# Patient Record
Sex: Male | Born: 1990 | Hispanic: No | Marital: Single | Smoking: Never smoker
Health system: Southern US, Community
[De-identification: ages and names within clinical notes are randomized; demographics above are authoritative.]

---
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
## 2014-11-12 ENCOUNTER — Emergency Department (INDEPENDENT_AMBULATORY_CARE_PROVIDER_SITE_OTHER): Payer: 59

## 2014-11-12 ENCOUNTER — Encounter (HOSPITAL_COMMUNITY): Payer: Self-pay | Admitting: Emergency Medicine

## 2014-11-12 ENCOUNTER — Emergency Department (HOSPITAL_COMMUNITY)
Admission: EM | Admit: 2014-11-12 | Discharge: 2014-11-12 | Disposition: A | Discharge status: Home / Self Care | Payer: 59 | Source: Home / Self Care | Attending: Family Medicine | Admitting: Family Medicine

## 2014-11-12 DIAGNOSIS — Z23 Encounter for immunization: Secondary | ICD-10-CM

## 2014-11-12 DIAGNOSIS — S6710XA Crushing injury of unspecified finger(s), initial encounter: Secondary | ICD-10-CM

## 2014-11-12 DIAGNOSIS — S6000XA Contusion of unspecified finger without damage to nail, initial encounter: Secondary | ICD-10-CM

## 2014-11-12 DIAGNOSIS — S6010XA Contusion of unspecified finger with damage to nail, initial encounter: Secondary | ICD-10-CM

## 2014-11-12 MED ORDER — TETANUS-DIPHTH-ACELL PERTUSSIS 5-2.5-18.5 LF-MCG/0.5 IM SUSP
INTRAMUSCULAR | Status: AC
  Filled 2014-11-12: qty 0.5

## 2014-11-12 MED ORDER — IPRATROPIUM-ALBUTEROL 0.5-2.5 (3) MG/3ML IN SOLN
RESPIRATORY_TRACT | Status: AC
Start: 2014-11-12 — End: 2014-11-12
  Filled 2014-11-12: qty 3

## 2014-11-12 MED ORDER — TETANUS-DIPHTH-ACELL PERTUSSIS 5-2.5-18.5 LF-MCG/0.5 IM SUSP
0.5000 mL | Freq: Once | INTRAMUSCULAR | Status: AC
  Administered 2014-11-12: 0.5 mL via INTRAMUSCULAR

## 2014-11-12 MED ORDER — ALBUTEROL SULFATE (2.5 MG/3ML) 0.083% IN NEBU
INHALATION_SOLUTION | RESPIRATORY_TRACT | Status: AC
  Filled 2014-11-12: qty 3

## 2014-11-12 NOTE — Discharge Instructions (Signed)
Thank you for coming in today.  [facility]. Address: [address], [place], [place] [address] [phone]   Subungual Hematoma A subungual hematoma is a pocket of blood that collects under the fingernail or toenail. The pressure created by the blood under the nail can cause pain. CAUSES  A subungual hematoma occurs when an injury to the finger or toe causes a blood vessel beneath the nail to break. The injury can occur from a direct blow such as slamming a finger in a door. It can also occur from a repeated injury such as pressure on the foot in a shoe while running. A subungual hematoma is sometimes called runner's toe or tennis toe. SYMPTOMS   Blue or dark blue skin under the nail.  Pain or throbbing in the injured area. DIAGNOSIS  Your caregiver can determine whether you have a subungual hematoma based on your history and a physical exam. If your caregiver thinks you might have a broken (fractured) bone, X-rays may be taken. TREATMENT  Hematomas usually go away on their own over time. Your caregiver may make a hole in the nail to drain the blood. Draining the blood is painless and usually provides significant relief from pain and throbbing. The nail usually grows back normally after this procedure. In some cases, the nail may need to be removed. This is done if there is a cut under the nail that requires stitches (sutures). HOME CARE INSTRUCTIONS   Put ice on the injured area.  Put ice in a plastic bag.  Place a towel between your skin and the bag.  Leave the ice on for 15-20 minutes, 03-04 times a day for the first 1 to 2 days.  Elevate the injured area to help decrease pain and swelling.  If you were given a bandage, wear it for as long as directed by your caregiver.  If part of your nail falls off, trim the remaining nail gently. This prevents the nail from catching on something and causing further injury.  Only take  over-the-counter or prescription medicines for pain, discomfort, or fever as directed by your caregiver. SEEK IMMEDIATE MEDICAL CARE IF:   You have redness or swelling around the nail.  You have yellowish-white fluid (pus) coming from the nail.  Your pain is not controlled with medicine.  You have a fever. MAKE SURE YOU:  Understand these instructions.  Will watch your condition.  Will get help right away if you are not doing well or get worse. Document Released: 07/29/2000 Document Revised: 10/24/2011 Document Reviewed: 07/20/2011 [facility] Patient Information 2015 [employer] [place]. This information is not intended to replace advice given to you by your health care provider. Make sure you discuss any questions you have with your health care provider.

## 2014-11-12 NOTE — ED Provider Notes (Signed)
Ernest Cooper is a 24 y.o. male who presents to Urgent Care today for left fifth digit injury. Patient was mowing his yard at home when his left fifth digit became crushed between the lawnmower in the fence. He notes pain swelling and a small abrasion. He has not tried any treatment yet. No fevers or chills nausea vomiting or diarrhea. Symptoms are moderate. Patient cannot recall his last tetanus vaccine.   History reviewed. No pertinent past medical history. History reviewed. No pertinent past surgical history. History  Substance Use Topics  . Smoking status: Never Smoker   . Smokeless tobacco: Not on file  . Alcohol Use: No   ROS as above Medications: No current facility-administered medications for this encounter.   No current outpatient prescriptions on file.   No Known Allergies   Exam:  BP 113/82 mmHg  Pulse 60  Temp(Src) 98.6 F (37 C) (Oral)  Resp 16  SpO2 100% Gen: Well NAD Left hand. Swollen erythremia and subungual hematoma with small abrasion present at the distal left fifth phalanx. Motion intact. Sensation and capillary refill intact. Tender to touch. Hand is otherwise normal with normal motion pulses capillary refill sensation and grip.   Patient was given a Tdap vaccine prior to DC.  No results found for this or any previous visit (from the past 24 hour(s)). Dg Finger Little Left  11/12/2014   CLINICAL DATA:  Fifth finger crush injury  EXAM: LEFT LITTLE FINGER 2+V  COMPARISON:  None.  FINDINGS: Three views of the left fifth finger submitted. No acute fracture or subluxation. No radiopaque foreign body.  IMPRESSION: Negative.   Electronically Signed   By: Natasha MeadLiviu  Pop M.D.   On: 11/12/2014 09:39    Assessment and Plan: 24 y.o. male with crush injury with subungual hematoma to the left fifth nail. Patient declines attempted drainage of subungual hematoma. Plan for STACK splint NSAIDs watchful waiting. Return as needed.  Discussed warning signs or symptoms. Please  see discharge instructions. Patient expresses understanding.     Ernest BongEvan S Collins Dimaria, MD 11/12/14 [phone]

## 2014-11-12 NOTE — ED Notes (Signed)
Reports he inj 5th left digit yest while mowing grass Pressed finger against lawn mower and fence Has a subungual hematoma Alert, no signs of acute distress.

## 2016-04-16 ENCOUNTER — Ambulatory Visit (INDEPENDENT_AMBULATORY_CARE_PROVIDER_SITE_OTHER): Payer: Self-pay | Admitting: Urgent Care

## 2016-04-16 VITALS — BP 128/76 | HR 80 | Temp 98.7°F | Resp 16 | Ht 67.0 in | Wt 159.0 lb

## 2016-04-16 DIAGNOSIS — L299 Pruritus, unspecified: Secondary | ICD-10-CM

## 2016-04-16 DIAGNOSIS — R21 Rash and other nonspecific skin eruption: Secondary | ICD-10-CM

## 2016-04-16 LAB — POCT CBC
Granulocyte percent: 53.9 %G (ref 37–80)
HEMATOCRIT: 36.7 % — AB (ref 43.5–53.7)
Hemoglobin: 11.3 g/dL — AB (ref 14.1–18.1)
LYMPH, POC: 2.7 (ref 0.6–3.4)
MCH, POC: 16.9 pg — AB (ref 27–31.2)
MCHC: 30.9 g/dL — AB (ref 31.8–35.4)
MCV: 54.6 fL — AB (ref 80–97)
MID (cbc): 0.9 (ref 0–0.9)
MPV: 11.6 fL (ref 0–99.8)
POC GRANULOCYTE: 4.2 (ref 2–6.9)
POC LYMPH %: 35.1 % (ref 10–50)
POC MID %: 11 %M (ref 0–12)
Platelet Count, POC: 297 10*3/uL (ref 142–424)
RBC: 6.73 M/uL — AB (ref 4.69–6.13)
RDW, POC: 26.8 %
WBC: 7.8 10*3/uL (ref 4.6–10.2)

## 2016-04-16 LAB — POCT SKIN KOH: Skin KOH, POC: NEGATIVE

## 2016-04-16 MED ORDER — PREDNISONE 20 MG PO TABS: 40.0000 mg | ORAL_TABLET | Freq: Every day | ORAL | 0 refills | Status: DC

## 2016-04-16 MED ORDER — CETIRIZINE HCL 10 MG PO TABS: 10.0000 mg | ORAL_TABLET | Freq: Every day | ORAL | 11 refills | Status: DC

## 2016-04-16 NOTE — Progress Notes (Signed)
    MRN: [id number] DOB: 09/18/1990  Subjective:   Ernest Cooper is a [age] y.o. male presenting for chief complaint of Rash (On arms, back and neck x 2 weeks)  Reports 1 week history of pruritic rash that started over back and has now spread to his chest and arms. Patient has tried otc creams without relief. His co-worker did give patient 2 steroid pills which helped but the rash came right back. Patient admits working in the yard prior to the rash starting but is not aware of coming into contact with any poisonous plants. Denies fever, cough, headache, n/v, abdominal pain, lesions over mouth, legs, genital area. He has not changed hygiene products, no new medications. Patient uses whey protein for shakes, works out a gym. He has a dog at home, has been around for several months now.  [facility] currently has no medications in their medication list. Also has No Known Allergies.  [facility]  has no past medical history on file. Also  has no past surgical history on file.  Objective:   Vitals: BP 128/76   Pulse 80   Temp 98.7 F (37.1 C) (Oral)   Resp 16   Ht 5\' 7"  (1.702 m)   Wt 159 lb (72.1 kg)   SpO2 97%   BMI 24.90 kg/m   Physical Exam  Constitutional: He is oriented to person, place, and time. He appears well-developed and well-nourished.  HENT:  Mouth/Throat: Oropharynx is clear and moist.  Neck: Normal range of motion. Neck supple.  Cardiovascular: Normal rate, regular rhythm and intact distal pulses.  Exam reveals no gallop and no friction rub.   No murmur heard. Pulmonary/Chest: No respiratory distress. He has no wheezes. He has no rales.  Lymphadenopathy:    He has no cervical adenopathy.  Neurological: He is alert and oriented to person, place, and time.  Skin: Skin is warm and dry. Rash (maculopapular rash in clusters over mid-upper back, upper chest and upper inner part of biceps) noted.   Results for orders placed or performed in visit on 04/16/16 (from the past 24 hour(s))    POCT Skin KOH     Status: Normal   Collection Time: 04/16/16 12:58 PM  Result Value Ref Range   Skin KOH, POC Negative    Assessment and Plan :   This case was precepted with Dr. Cleta Albertsaub.   1. Rash and nonspecific skin eruption 2. Itching - Suspect a contact dermatitis rash to unknown offending agent. Will start patient with prednisone taper, Zyrtec for itching. Advised patient return to clinic if he has rebound rash, consider adding additional 1-2 weeks of steroid taper to address contact dermatitis to plant given that he was doing yardwork just prior to getting this rash. Patient is in agreement.  Wallis BambergMario Traves Majchrzak, PA-C [facility] [phone] 04/16/2016 12:11 PM

## 2016-04-16 NOTE — Patient Instructions (Addendum)
Rash A rash is a change in the color or texture of the skin. There are many different types of rashes. You may have other problems that accompany your rash. CAUSES   Infections.  Allergic reactions. This can include allergies to pets or foods.  Certain medicines.  Exposure to certain chemicals, soaps, or cosmetics.  Heat.  Exposure to poisonous plants.  Tumors, both cancerous and noncancerous. SYMPTOMS   Redness.  Scaly skin.  Itchy skin.  Dry or cracked skin.  Bumps.  Blisters.  Pain. DIAGNOSIS  Your caregiver may do a physical exam to determine what type of rash you have. A skin sample (biopsy) may be taken and examined under a microscope. TREATMENT  Treatment depends on the type of rash you have. Your caregiver may prescribe certain medicines. For serious conditions, you may need to see a skin doctor (dermatologist). HOME CARE INSTRUCTIONS   Avoid the substance that caused your rash.  Do not scratch your rash. This can cause infection.  You may take cool baths to help stop itching.  Only take over-the-counter or prescription medicines as directed by your caregiver.  Keep all follow-up appointments as directed by your caregiver. SEEK IMMEDIATE MEDICAL CARE IF:  You have increasing pain, swelling, or redness.  You have a fever.  You have new or severe symptoms.  You have body aches, diarrhea, or vomiting.  Your rash is not better after 3 days. MAKE SURE YOU:  Understand these instructions.  Will watch your condition.  Will get help right away if you are not doing well or get worse.   This information is not intended to replace advice given to you by your health care provider. Make sure you discuss any questions you have with your health care provider.   Document Released: 07/22/2002 Document Revised: 08/22/2014 Document Reviewed: 12/17/2014 Elsevier Interactive Patient Education 2016 Elsevier Inc.     IF you received an x-ray today, you will  receive an invoice from [facility]. Please contact [facility] at [phone] with questions or concerns regarding your invoice.   IF you received labwork today, you will receive an invoice from [facility]. Please contact [facility] at [phone] with questions or concerns regarding your invoice.   Our billing staff will not be able to assist you with questions regarding bills from these companies.  You will be contacted with the lab results as soon as they are available. The fastest way to get your results is to activate your My Chart account. Instructions are located on the last page of this paperwork. If you have not heard from us regarding the results in 2 weeks, please contact this office.      

## 2016-04-23 ENCOUNTER — Encounter: Payer: Self-pay | Admitting: Family Medicine

## 2016-04-23 ENCOUNTER — Ambulatory Visit (INDEPENDENT_AMBULATORY_CARE_PROVIDER_SITE_OTHER): Payer: Self-pay | Admitting: Family Medicine

## 2016-04-23 VITALS — BP 120/64 | HR 79 | Temp 98.7°F | Resp 16 | Ht 67.0 in | Wt 162.6 lb

## 2016-04-23 DIAGNOSIS — L259 Unspecified contact dermatitis, unspecified cause: Secondary | ICD-10-CM

## 2016-04-23 MED ORDER — TRIAMCINOLONE ACETONIDE 0.1 % EX CREA: 1.0000 "application " | TOPICAL_CREAM | Freq: Four times a day (QID) | CUTANEOUS | 0 refills | Status: DC | PRN

## 2016-04-23 MED ORDER — HYDROXYZINE HCL 25 MG PO TABS: 25.0000 mg | ORAL_TABLET | ORAL | 0 refills | Status: DC | PRN

## 2016-04-23 NOTE — Patient Instructions (Addendum)
   IF you received an x-ray today, you will receive an invoice from [facility]. Please contact  Radiology at [phone] with questions or concerns regarding your invoice.   IF you received labwork today, you will receive an invoice from [facility]. Please contact [facility] at [phone] with questions or concerns regarding your invoice.   Our billing staff will not be able to assist you with questions regarding bills from these companies.  You will be contacted with the lab results as soon as they are available. The fastest way to get your results is to activate your My Chart account. Instructions are located on the last page of this paperwork. If you have not heard from us regarding the results in 2 weeks, please contact this office.     Contact Dermatitis Dermatitis is redness, soreness, and swelling (inflammation) of the skin. Contact dermatitis is a reaction to certain substances that touch the skin. There are two types of contact dermatitis:   Irritant contact dermatitis. This type is caused by something that irritates your skin, such as dry hands from washing them too much. This type does not require previous exposure to the substance for a reaction to occur. This type is more common.  Allergic contact dermatitis. This type is caused by a substance that you are allergic to, such as a nickel allergy or poison ivy. This type only occurs if you have been exposed to the substance (allergen) before. Upon a repeat exposure, your body reacts to the substance. This type is less common. CAUSES  Many different substances can cause contact dermatitis. Irritant contact dermatitis is most commonly caused by exposure to:   Makeup.   Soaps.   Detergents.   Bleaches.   Acids.   Metal salts, such as nickel.  Allergic contact dermatitis is most commonly caused by exposure to:   Poisonous plants.   Chemicals.   Jewelry.   Latex.    Medicines.   Preservatives in products, such as clothing.  RISK FACTORS This condition is more likely to develop in:   People who have jobs that expose them to irritants or allergens.  People who have certain medical conditions, such as asthma or eczema.  SYMPTOMS  Symptoms of this condition may occur anywhere on your body where the irritant has touched you or is touched by you. Symptoms include:  Dryness or flaking.   Redness.   Cracks.   Itching.   Pain or a burning feeling.   Blisters.  Drainage of small amounts of blood or clear fluid from skin cracks. With allergic contact dermatitis, there may also be swelling in areas such as the eyelids, mouth, or genitals.  DIAGNOSIS  This condition is diagnosed with a medical history and physical exam. A patch skin test may be performed to help determine the cause. If the condition is related to your job, you may need to see an occupational medicine specialist. TREATMENT Treatment for this condition includes figuring out what caused the reaction and protecting your skin from further contact. Treatment may also include:   Steroid creams or ointments. Oral steroid medicines may be needed in more severe cases.  Antibiotics or antibacterial ointments, if a skin infection is present.  Antihistamine lotion or an antihistamine taken by mouth to ease itching.  A bandage (dressing). HOME CARE INSTRUCTIONS Skin Care  Moisturize your skin as needed.   Apply cool compresses to the affected areas.  Try taking a bath with:  Epsom salts. Follow the instructions   on the packaging. You can get these at your local pharmacy or grocery store.  Baking soda. Pour a small amount into the bath as directed by your health care provider.  Colloidal oatmeal. Follow the instructions on the packaging. You can get this at your local pharmacy or grocery store.  Try applying baking soda paste to your skin. Stir water into baking soda until  it reaches a paste-like consistency.  Do not scratch your skin.  Bathe less frequently, such as every other day.  Bathe in lukewarm water. Avoid using hot water. Medicines  Take or apply over-the-counter and prescription medicines only as told by your health care provider.   If you were prescribed an antibiotic medicine, take or apply your antibiotic as told by your health care provider. Do not stop using the antibiotic even if your condition starts to improve. General Instructions  Keep all follow-up visits as told by your health care provider. This is important.  Avoid the substance that caused your reaction. If you do not know what caused it, keep a journal to try to track what caused it. Write down:  What you eat.  What cosmetic products you use.  What you drink.  What you wear in the affected area. This includes jewelry.  If you were given a dressing, take care of it as told by your health care provider. This includes when to change and remove it. SEEK MEDICAL CARE IF:   Your condition does not improve with treatment.  Your condition gets worse.  You have signs of infection such as swelling, tenderness, redness, soreness, or warmth in the affected area.  You have a fever.  You have new symptoms. SEEK IMMEDIATE MEDICAL CARE IF:   You have a severe headache, neck pain, or neck stiffness.  You vomit.  You feel very sleepy.  You notice red streaks coming from the affected area.  Your bone or joint underneath the affected area becomes painful after the skin has healed.  The affected area turns darker.  You have difficulty breathing.   This information is not intended to replace advice given to you by your health care provider. Make sure you discuss any questions you have with your health care provider.   Document Released: 07/29/2000 Document Revised: 04/22/2015 Document Reviewed: 12/17/2014 Elsevier Interactive Patient Education 2016 Elsevier Inc.  

## 2016-04-23 NOTE — Progress Notes (Signed)
Subjective:  By signing my name below, I, Raven Small, attest that this documentation has been prepared under the direction and in the presence of Norberto Sorenson, MD.  Electronically Signed: Andrew Au, ED Scribe. 04/23/2016. 2:35 PM.   Patient ID: Ernest Cooper, male    DOB: Jul 10, 1991, 25 y.o.   MRN: [id number]  HPI Chief Complaint  Patient presents with  . Follow-up    Rash, on arms, chest and back    HPI Comments: Ernest Cooper is a 25 y.o. male who [facility] the [facility] follow up. Pt was seen 1 week ago by Gurney Maxin. At that time he had 1 week rash that began on back and spread across upper arms and chest, thought to be contact dermitis; treated with prednisone taper and zyrtec. Possibility of Rhus.   Pt reports improvement to rash and itchiness while on medication, but once he finish itchiness returned. Pt finished medication 3 days ago. He denies worsening symptoms or new areas of rash. He takes zyrtec every morning with breakfast but other treatment tried.   There are no active problems to display for this patient.  No past medical history on file. No past surgical history on file. No Known Allergies Prior to Admission medications   Medication Sig Start Date End Date Taking? Authorizing Provider  cetirizine (ZYRTEC) 10 MG tablet Take 1 tablet (10 mg total) by mouth daily. 04/16/16  Yes Wallis Bamberg, PA-C  predniSONE (DELTASONE) 20 MG tablet Take 2 tablets (40 mg total) by mouth daily with breakfast. Patient not taking: Reported on 04/23/2016 04/16/16   Wallis Bamberg, PA-C   Social History   Social History  . Marital status: Single    Spouse name: N/A  . Number of children: N/A  . Years of education: N/A   Occupational History  . Not on file.   Social History Main Topics  . Smoking status: Never Smoker  . Smokeless tobacco: Not on file  . Alcohol use No  . Drug use: No  . Sexual activity: Not on file   Other Topics Concern  . Not on file   Social  History Narrative  . No narrative on file   Review of Systems  Constitutional: Negative for activity change, chills and fever.  Cardiovascular: Negative for leg swelling.  Musculoskeletal: Negative for arthralgias, back pain, gait problem, joint swelling, myalgias, neck pain and neck stiffness.  Skin: Positive for rash. Negative for color change, pallor and wound.  Allergic/Immunologic: Negative for environmental allergies, food allergies and immunocompromised state.  Neurological: Negative for weakness and numbness.  Hematological: Negative for adenopathy. Does not bruise/bleed easily.       Objective:   Physical Exam  Constitutional: He is oriented to person, place, and time. He appears well-developed and well-nourished. No distress.  HENT:  Head: Normocephalic and atraumatic.  Eyes: Conjunctivae and EOM are normal.  Neck: Neck supple.  Cardiovascular: Normal rate.   Pulmonary/Chest: Effort normal.  Musculoskeletal: Normal range of motion.  Neurological: He is alert and oriented to person, place, and time.  Skin: Skin is warm and dry.  Very fine pin point papules along inner aspects.   Psychiatric: He has a normal mood and affect. His behavior is normal.  Nursing note and vitals reviewed.  Vitals:   04/23/16 1414  BP: 120/64  Pulse: 79  Resp: 16  Temp: 98.7 F (37.1 C)  TempSrc: Oral  SpO2: 99%  Weight: 162 lb 9.6 oz (73.8 kg)  Height: 5\' 7"  (1.702 m)    Assessment & Plan:   1. Contact dermatitis   Very subtle recurrence since cessation of po pred taper 2d prev.  Likely rhus dermatitis - start below, cont sched zyrtec   Meds ordered this encounter  Medications  . triamcinolone cream (KENALOG) 0.1 %    Sig: Apply 1 application topically 4 (four) times daily as needed.    Dispense:  453 g    Refill:  0  . hydrOXYzine (ATARAX/VISTARIL) 25 MG tablet    Sig: Take 1 tablet (25 mg total) by mouth every 4 (four) hours as needed for itching.    Dispense:  30 tablet     Refill:  0    I personally performed the services described in this documentation, which was scribed in my presence. The recorded information has been reviewed and considered, and addended by me as needed.   Norberto SorensonEva Kamira Mellette, M.D.  [facility]  [place] [address] [place], [place] [address] [phone] phone [phone] fax  04/25/16 11:37 PM

## 2016-05-27 IMAGING — DX DG FINGER LITTLE 2+V*L*
3 series · 3 of 3 positions shown · non-contrast
Comparison: None.

CLINICAL DATA: Fifth finger crush injury

EXAM:
LEFT LITTLE FINGER 2+V

[finger ap]
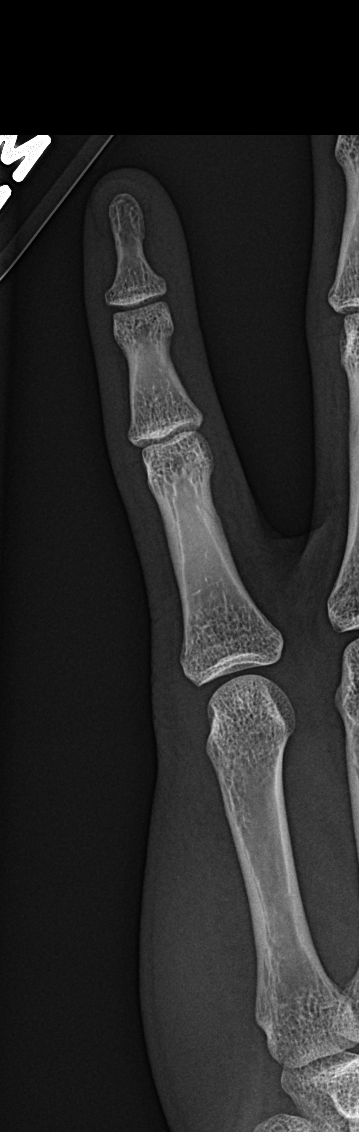

[finger obl]
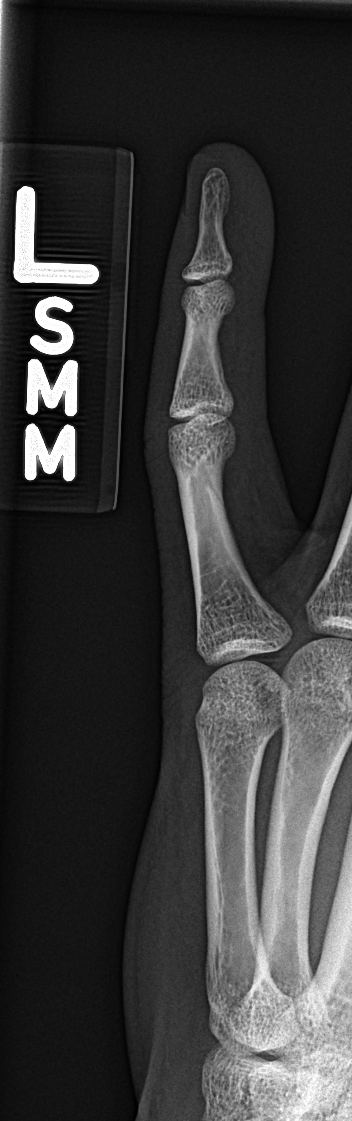

[finger lat]
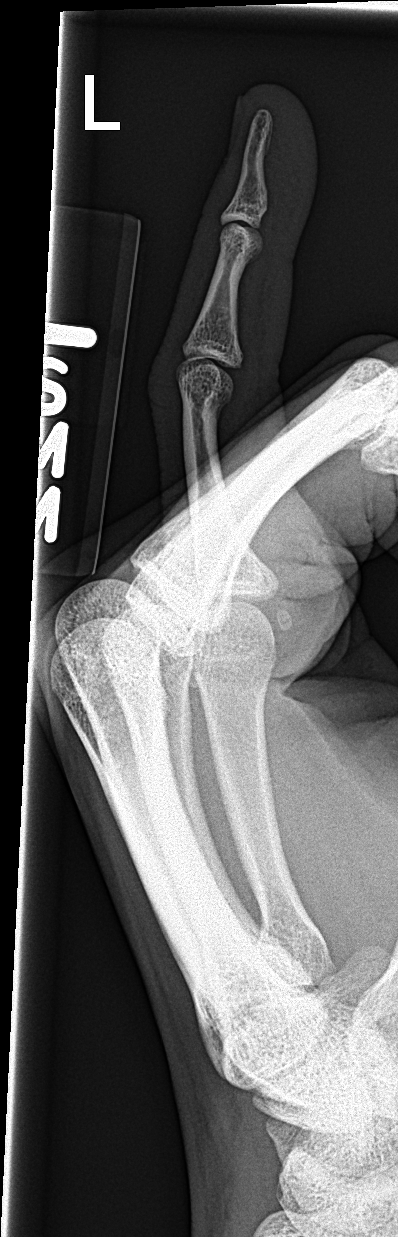

[3 of 3 positions shown; findings below may reference images not displayed]

FINDINGS: Three views of the left fifth finger submitted. No acute fracture or
subluxation. No radiopaque foreign body.
IMPRESSION: Negative.

## 2018-08-14 ENCOUNTER — Ambulatory Visit (HOSPITAL_COMMUNITY)
Admission: EM | Admit: 2018-08-14 | Discharge: 2018-08-14 | Disposition: A | Discharge status: Home / Self Care | Payer: Self-pay | Attending: Emergency Medicine | Admitting: Emergency Medicine

## 2018-08-14 ENCOUNTER — Encounter (HOSPITAL_COMMUNITY): Payer: Self-pay

## 2018-08-14 DIAGNOSIS — R05 Cough: Secondary | ICD-10-CM | POA: Insufficient documentation

## 2018-08-14 DIAGNOSIS — R059 Cough, unspecified: Secondary | ICD-10-CM

## 2018-08-14 DIAGNOSIS — R509 Fever, unspecified: Secondary | ICD-10-CM | POA: Insufficient documentation

## 2018-08-14 DIAGNOSIS — R52 Pain, unspecified: Secondary | ICD-10-CM | POA: Insufficient documentation

## 2018-08-14 DIAGNOSIS — R6889 Other general symptoms and signs: Secondary | ICD-10-CM | POA: Insufficient documentation

## 2018-08-14 MED ORDER — ACETAMINOPHEN 325 MG PO TABS
ORAL_TABLET | ORAL | Status: AC
  Filled 2018-08-14: qty 2

## 2018-08-14 MED ORDER — OSELTAMIVIR PHOSPHATE 75 MG PO CAPS: 75.0000 mg | ORAL_CAPSULE | Freq: Two times a day (BID) | ORAL | 0 refills | Status: DC

## 2018-08-14 MED ORDER — ACETAMINOPHEN 325 MG PO TABS
650.0000 mg | ORAL_TABLET | Freq: Once | ORAL | Status: AC
  Administered 2018-08-14: 650 mg via ORAL

## 2018-08-14 MED ORDER — PREDNISONE 10 MG (21) PO TBPK: ORAL_TABLET | Freq: Every day | ORAL | 0 refills | Status: DC

## 2018-08-14 NOTE — ED Provider Notes (Signed)
[facility]    CSN: [id number] Arrival date & time: 08/14/18  0831     History   Chief Complaint Chief Complaint  Patient presents with  . Generalized Body Aches  . Chills    HPI Ernest Cooper is a 27 y.o. male.   Pt here for fever, weakness, body aches, and cough. Pt states that his son was dx with flu last week. He has been feeling bad since sat/sun. States that he has taken several OTC for sx with no relief. Did not get the flu shot this year.      History reviewed. No pertinent past medical history.  There are no active problems to display for this patient.   History reviewed. No pertinent surgical history.     Home Medications    Prior to Admission medications   Medication Sig Start Date End Date Taking? Authorizing Provider  cetirizine (ZYRTEC) 10 MG tablet Take 1 tablet (10 mg total) by mouth daily. 04/16/16   Wallis BambergMani, Mario, PA-C  hydrOXYzine (ATARAX/VISTARIL) 25 MG tablet Take 1 tablet (25 mg total) by mouth every 4 (four) hours as needed for itching. 04/23/16   Sherren MochaShaw, Eva N, MD  oseltamivir (TAMIFLU) 75 MG capsule Take 1 capsule (75 mg total) by mouth every 12 (twelve) hours. 08/14/18   Coralyn MarkMitchell, Tedi Hughson L, NP  predniSONE (STERAPRED UNI-PAK 21 TAB) 10 MG (21) TBPK tablet Take by mouth daily. Take 6 tabs by mouth daily  for 2 days, then 5 tabs for 2 days, then 4 tabs for 2 days, then 3 tabs for 2 days, 2 tabs for 2 days, then 1 tab by mouth daily for 2 days 08/14/18   Coralyn MarkMitchell, Consuella Scurlock L, NP  triamcinolone cream (KENALOG) 0.1 % Apply 1 application topically 4 (four) times daily as needed. 04/23/16   Sherren MochaShaw, Eva N, MD    Family History History reviewed. No pertinent family history.  Social History Social History   Tobacco Use  . Smoking status: Never Smoker  Substance Use Topics  . Alcohol use: No  . Drug use: No     Allergies   Patient has no known allergies.   Review of Systems Review of Systems  Constitutional: Positive for activity change,  chills, fatigue and fever.  HENT: Negative.   Eyes: Negative.   Respiratory: Positive for cough.   Cardiovascular: Negative.   Gastrointestinal: Negative.   Genitourinary: Negative.   Musculoskeletal:       Bodyaches   Skin: Negative.   Neurological: Negative.      Physical Exam Triage Vital Signs ED Triage Vitals  Enc Vitals Group     BP 08/14/18 0942 124/75     Pulse Rate 08/14/18 0942 (!) 106     Resp 08/14/18 0942 18     Temp 08/14/18 0942 (!) 101.7 F (38.7 C)     Temp Source 08/14/18 0942 Oral     SpO2 08/14/18 0942 100 %     Weight --      Height --      Head Circumference --      Peak Flow --      Pain Score 08/14/18 0943 8     Pain Loc --      Pain Edu? --      Excl. in [place]? --    No data found.  Updated Vital Signs BP 124/75 (BP Location: Left Arm)   Pulse (!) 106   Temp (!) 101.7 F (38.7 C) (Oral)   Resp 18  SpO2 100%   Visual Acuity  Physical Exam Constitutional:      Appearance: Normal appearance.  HENT:     Head: Normocephalic.     Right Ear: Tympanic membrane normal.     Left Ear: Tympanic membrane normal.     Nose: Nose normal.     Mouth/Throat:     Mouth: Mucous membranes are moist.  Eyes:     Pupils: Pupils are equal, round, and reactive to light.  Neck:     Musculoskeletal: Normal range of motion.  Cardiovascular:     Rate and Rhythm: Tachycardia present.  Pulmonary:     Comments: Dry cough  Abdominal:     General: Abdomen is flat.  Musculoskeletal: Normal range of motion.  Skin:    Comments: Hot to touch   Neurological:     General: No focal deficit present.     Mental Status: He is alert.      UC Treatments / Results  Labs (all labs ordered are listed, but only abnormal results are displayed) Labs Reviewed - No data to display  EKG None  Radiology No results found.  Procedures Procedures (including critical care time)  Medications Ordered in UC Medications  acetaminophen (TYLENOL) tablet 650 mg (650 mg  Oral Given 08/14/18 0950)    Initial Impression / Assessment and Plan / UC Course  I have reviewed the triage vital signs and the nursing notes.  Pertinent labs & imaging results that were available during my care of the patient were reviewed by me and considered in my medical decision making (see chart for details).  Pushing fluids  Take meds as needed for fever Avoid contact with others and wash hands  Go to er if sx become worse   Final Clinical Impressions(s) / UC Diagnoses   Final diagnoses:  Flu-like symptoms  Cough  Generalized body aches  Fever, unspecified     Discharge Instructions     Drink plenty of fluids Take tylenol or motrin as needed for fever      ED Prescriptions    Medication Sig Dispense Auth. Provider   predniSONE (STERAPRED UNI-PAK 21 TAB) 10 MG (21) TBPK tablet Take by mouth daily. Take 6 tabs by mouth daily  for 2 days, then 5 tabs for 2 days, then 4 tabs for 2 days, then 3 tabs for 2 days, 2 tabs for 2 days, then 1 tab by mouth daily for 2 days 42 tablet Maple MirzaMitchell, Rochell Mabie L, NP   oseltamivir (TAMIFLU) 75 MG capsule Take 1 capsule (75 mg total) by mouth every 12 (twelve) hours. 10 capsule Coralyn MarkMitchell, Servando Kyllonen L, NP     Controlled Substance Prescriptions [place] Controlled Substance Registry consulted? Not Applicable   Coralyn MarkMitchell, Yasira Engelson L, NP 08/14/18 1019

## 2018-08-14 NOTE — ED Triage Notes (Signed)
Pt presents generalized body aches, fever and chills. Symptoms started on Saturday. Pt has tried OTC medication but no relief

## 2018-08-14 NOTE — Discharge Instructions (Signed)
Drink plenty of fluids Take tylenol or motrin as needed for fever

## 2022-08-28 ENCOUNTER — Ambulatory Visit
Admission: EM | Admit: 2022-08-28 | Discharge: 2022-08-28 | Disposition: A | Discharge status: Home / Self Care | Payer: Self-pay | Attending: Internal Medicine | Admitting: Internal Medicine

## 2022-08-28 ENCOUNTER — Ambulatory Visit (INDEPENDENT_AMBULATORY_CARE_PROVIDER_SITE_OTHER): Payer: Self-pay

## 2022-08-28 DIAGNOSIS — R053 Chronic cough: Secondary | ICD-10-CM

## 2022-08-28 DIAGNOSIS — R059 Cough, unspecified: Secondary | ICD-10-CM

## 2022-08-28 MED ORDER — BENZONATATE 100 MG PO CAPS: 100.0000 mg | ORAL_CAPSULE | Freq: Three times a day (TID) | ORAL | 0 refills | Status: DC | PRN

## 2022-08-28 MED ORDER — PREDNISONE 20 MG PO TABS: 40.0000 mg | ORAL_TABLET | Freq: Every day | ORAL | 0 refills | Status: AC

## 2022-08-28 NOTE — ED Triage Notes (Signed)
Pt presents with c/o cough x3 days.   States he has taken OTC medicine for relief.   States he took Robitussin and Mucinex. Reports he coughs up mucus and c/o the congestion worsening when laying down.

## 2022-08-28 NOTE — ED Provider Notes (Signed)
[facility]    CSN: [id number] Arrival date & time: 08/28/22  1003      History   Chief Complaint Chief Complaint  Patient presents with   Cough    HPI Ernest Cooper is a 32 y.o. male.   Patient presents with productive cough that has been present for 2 weeks.  He reports that he has intermittent nasal congestion but this has basically resolved.  Denies any associated fever.  Denies any known sick contacts.  Denies chest pain, shortness of breath, sore throat, ear pain, nausea, vomiting, diarrhea, abdominal pain.  Patient denies history of asthma and does not smoke cigarettes.  Has taken Robitussin and Mucinex with minimal improvement in symptoms.   Cough   History reviewed. No pertinent past medical history.  There are no problems to display for this patient.   History reviewed. No pertinent surgical history.     Home Medications    Prior to Admission medications   Medication Sig Start Date End Date Taking? Authorizing Provider  benzonatate (TESSALON) 100 MG capsule Take 1 capsule (100 mg total) by mouth every 8 (eight) hours as needed for cough. 08/28/22  Yes Latrese Carolan, Hildred Alamin E, FNP  predniSONE (DELTASONE) 20 MG tablet Take 2 tablets (40 mg total) by mouth daily for 5 days. 08/28/22 09/02/22 Yes Ruqayyah Lute, Michele Rockers, FNP  cetirizine (ZYRTEC) 10 MG tablet Take 1 tablet (10 mg total) by mouth daily. 04/16/16   Jaynee Eagles, PA-C  hydrOXYzine (ATARAX/VISTARIL) 25 MG tablet Take 1 tablet (25 mg total) by mouth every 4 (four) hours as needed for itching. 04/23/16   Shawnee Knapp, MD  oseltamivir (TAMIFLU) 75 MG capsule Take 1 capsule (75 mg total) by mouth every 12 (twelve) hours. 08/14/18   Marney Setting, NP  triamcinolone cream (KENALOG) 0.1 % Apply 1 application topically 4 (four) times daily as needed. 04/23/16   Shawnee Knapp, MD    Family History History reviewed. No pertinent family history.  Social History Social History   Tobacco Use   Smoking status: Never   Substance Use Topics   Alcohol use: No   Drug use: No     Allergies   Patient has no known allergies.   Review of Systems Review of Systems Per HPI  Physical Exam Triage Vital Signs ED Triage Vitals  Enc Vitals Group     BP 08/28/22 1115 (!) 99/51     Pulse Rate 08/28/22 1115 70     Resp 08/28/22 1115 18     Temp 08/28/22 1115 98.2 F (36.8 C)     Temp Source 08/28/22 1115 Oral     SpO2 08/28/22 1115 97 %     Weight --      Height --      Head Circumference --      Peak Flow --      Pain Score 08/28/22 1114 0     Pain Loc --      Pain Edu? --      Excl. in [place]? --    No data found.  Updated Vital Signs BP 130/76   Pulse 70   Temp 98.2 F (36.8 C) (Oral)   Resp 18   SpO2 97%   Visual Acuity Right Eye Distance:   Left Eye Distance:   Bilateral Distance:    Right Eye Near:   Left Eye Near:    Bilateral Near:     Physical Exam Constitutional:      General: He is  not in acute distress.    Appearance: Normal appearance. He is not toxic-appearing or diaphoretic.  HENT:     Head: Normocephalic and atraumatic.     Right Ear: Tympanic membrane and ear canal normal.     Left Ear: Tympanic membrane and ear canal normal.     Nose: No congestion.     Mouth/Throat:     Mouth: Mucous membranes are moist.     Pharynx: No posterior oropharyngeal erythema.  Eyes:     Extraocular Movements: Extraocular movements intact.     Conjunctiva/sclera: Conjunctivae normal.     Pupils: Pupils are equal, round, and reactive to light.  Cardiovascular:     Rate and Rhythm: Normal rate and regular rhythm.     Pulses: Normal pulses.     Heart sounds: Normal heart sounds.  Pulmonary:     Effort: Pulmonary effort is normal. No respiratory distress.     Breath sounds: Normal breath sounds. No stridor. No wheezing, rhonchi or rales.  Abdominal:     General: Abdomen is flat. Bowel sounds are normal.     Palpations: Abdomen is soft.  Musculoskeletal:        General: Normal  range of motion.     Cervical back: Normal range of motion.  Skin:    General: Skin is warm and dry.  Neurological:     General: No focal deficit present.     Mental Status: He is alert and oriented to person, place, and time. Mental status is at baseline.  Psychiatric:        Mood and Affect: Mood normal.        Behavior: Behavior normal.        Thought Content: Thought content normal.        Judgment: Judgment normal.      UC Treatments / Results  Labs (all labs ordered are listed, but only abnormal results are displayed) Labs Reviewed - No data to display  EKG   Radiology DG Chest 2 View  Result Date: 08/28/2022 CLINICAL DATA:  Pt presents with c/o cough x3 days. Reports he coughs up mucus and c/o the congestion worsening when laying down. EXAM: CHEST - 2 VIEW COMPARISON:  None Available. FINDINGS: The cardiomediastinal contours are within normal limits. The lungs are clear. No pneumothorax or pleural effusion. No acute finding in the visualized skeleton. IMPRESSION: No active cardiopulmonary disease. Electronically Signed   By: Emmaline Kluver M.D.   On: 08/28/2022 12:02    Procedures Procedures (including critical care time)  Medications Ordered in UC Medications - No data to display  Initial Impression / Assessment and Plan / UC Course  I have reviewed the triage vital signs and the nursing notes.  Pertinent labs & imaging results that were available during my care of the patient were reviewed by me and considered in my medical decision making (see chart for details).     Chest x-ray completed due to productive cough which was negative for any acute cardiopulmonary process.  Suspect viral cause of symptoms with persistent viral cough.  Will treat with prednisone to decrease inflammation.  Cough medication also prescribed.  Do not think viral testing is necessary given duration of symptoms as it would not change treatment.  Patient was advised to follow-up if symptoms  persist or worsen.  Patient verbalized understanding and was agreeable with plan. Final Clinical Impressions(s) / UC Diagnoses   Final diagnoses:  Persistent cough     Discharge Instructions  Your x-ray was normal.  I have prescribed you prednisone and a cough medication to decrease inflammation and help alleviate cough.  Please follow-up if any symptoms persist or worsen.     ED Prescriptions     Medication Sig Dispense Auth. Provider   predniSONE (DELTASONE) 20 MG tablet Take 2 tablets (40 mg total) by mouth daily for 5 days. 10 tablet [place], [place] E, [place]   benzonatate (TESSALON) 100 MG capsule Take 1 capsule (100 mg total) by mouth every 8 (eight) hours as needed for cough. 21 capsule [place], Michele Rockers, [place]      PDMP not reviewed this encounter.   Teodora Medici, [place] 08/28/22 1221

## 2022-08-28 NOTE — Discharge Instructions (Addendum)
Your x-ray was normal.  I have prescribed you prednisone and a cough medication to decrease inflammation and help alleviate cough.  Please follow-up if any symptoms persist or worsen.

## 2022-09-01 ENCOUNTER — Ambulatory Visit
Admission: RE | Admit: 2022-09-01 | Discharge: 2022-09-01 | Disposition: A | Discharge status: Home / Self Care | Payer: Self-pay | Source: Ambulatory Visit | Attending: Physician Assistant | Admitting: Physician Assistant

## 2022-09-01 VITALS — BP 111/73 | HR 63 | Temp 97.9°F | Resp 16

## 2022-09-01 DIAGNOSIS — J209 Acute bronchitis, unspecified: Secondary | ICD-10-CM

## 2022-09-01 DIAGNOSIS — R051 Acute cough: Secondary | ICD-10-CM

## 2022-09-01 MED ORDER — AZITHROMYCIN 250 MG PO TABS: ORAL_TABLET | ORAL | 0 refills | Status: DC

## 2022-09-01 MED ORDER — ALBUTEROL SULFATE HFA 108 (90 BASE) MCG/ACT IN AERS: 1.0000 | INHALATION_SPRAY | Freq: Four times a day (QID) | RESPIRATORY_TRACT | 0 refills | Status: DC | PRN

## 2022-09-01 MED ORDER — DM-GUAIFENESIN ER 30-600 MG PO TB12: 1.0000 | ORAL_TABLET | Freq: Two times a day (BID) | ORAL | 0 refills | Status: DC

## 2022-09-01 NOTE — ED Provider Notes (Signed)
[facility]    CSN: [id number] Arrival date & time: 09/01/22  1216      History   Chief Complaint Chief Complaint  Patient presents with   Cough    Came in on Sunday for a cough checked up, was given medication to take but seems like the same amount of coughing still there. - Entered by patient    HPI Ernest Cooper is a 32 y.o. male.   62-year-old male presents with cough and chest congestion.  Patient indicates for the past 3 weeks he has been having persistent chest congestion and cough.  He relates that over the past 5 days it is still continued with production being purulent green.  He indicates he has had some coughing exacerbations and spasms which mainly occur at night and tend to keep him up.  He indicates he has taken the [employer] but this is not helping his cough, today is his last day on the steroid tablets.  He indicates he still continues to have cough.  He indicates he is not having any wheezing or shortness of breath.  He did have a chest x-ray on his last visit 08/28/2022 and the chest x-ray was normal.  Patient indicates he is not running any fever, chills, and is not having any wheezing.  He is just concerned because he continues to have persistent cough and congestion.   Cough   History reviewed. No pertinent past medical history.  There are no problems to display for this patient.   History reviewed. No pertinent surgical history.     Home Medications    Prior to Admission medications   Medication Sig Start Date End Date Taking? Authorizing Provider  albuterol (VENTOLIN HFA) 108 (90 Base) MCG/ACT inhaler Inhale 1-2 puffs into the lungs every 6 (six) hours as needed for wheezing or shortness of breath. 09/01/22  Yes Nyoka Lint, PA-C  azithromycin (ZITHROMAX Z-PAK) 250 MG tablet 2 tablets initially and then 1 tablet daily until completed. 09/01/22  Yes Nyoka Lint, PA-C  dextromethorphan-guaiFENesin [facility] DM) 30-600 MG 12hr tablet Take 1  tablet by mouth 2 (two) times daily. 09/01/22  Yes Nyoka Lint, PA-C  benzonatate (TESSALON) 100 MG capsule Take 1 capsule (100 mg total) by mouth every 8 (eight) hours as needed for cough. 08/28/22   Teodora Medici, FNP  cetirizine (ZYRTEC) 10 MG tablet Take 1 tablet (10 mg total) by mouth daily. 04/16/16   Jaynee Eagles, PA-C  hydrOXYzine (ATARAX/VISTARIL) 25 MG tablet Take 1 tablet (25 mg total) by mouth every 4 (four) hours as needed for itching. 04/23/16   Shawnee Knapp, MD  oseltamivir (TAMIFLU) 75 MG capsule Take 1 capsule (75 mg total) by mouth every 12 (twelve) hours. 08/14/18   Marney Setting, NP  predniSONE (DELTASONE) 20 MG tablet Take 2 tablets (40 mg total) by mouth daily for 5 days. 08/28/22 09/02/22  Teodora Medici, FNP  triamcinolone cream (KENALOG) 0.1 % Apply 1 application topically 4 (four) times daily as needed. 04/23/16   Shawnee Knapp, MD    Family History History reviewed. No pertinent family history.  Social History Social History   Tobacco Use   Smoking status: Never   Smokeless tobacco: Never  Vaping Use   Vaping Use: Never used  Substance Use Topics   Alcohol use: No   Drug use: No     Allergies   Patient has no known allergies.   Review of Systems Review of Systems  Respiratory:  Positive for cough.      Physical Exam Triage Vital Signs ED Triage Vitals [09/01/22 1225]  Enc Vitals Group     BP 111/73     Pulse Rate 63     Resp 16     Temp 97.9 F (36.6 C)     Temp Source Oral     SpO2 98 %     Weight      Height      Head Circumference      Peak Flow      Pain Score      Pain Loc      Pain Edu?      Excl. in [place]?    No data found.  Updated Vital Signs BP 111/73 (BP Location: Left Arm)   Pulse 63   Temp 97.9 F (36.6 C) (Oral)   Resp 16   SpO2 98%   Visual Acuity Right Eye Distance:   Left Eye Distance:   Bilateral Distance:    Right Eye Near:   Left Eye Near:    Bilateral Near:     Physical Exam Constitutional:       Appearance: Normal appearance.  HENT:     Right Ear: Tympanic membrane and ear canal normal.     Left Ear: Tympanic membrane and ear canal normal.     Mouth/Throat:     Mouth: Mucous membranes are moist.     Pharynx: Oropharynx is clear.  Cardiovascular:     Rate and Rhythm: Normal rate and regular rhythm.     Heart sounds: Normal heart sounds.  Pulmonary:     Effort: Pulmonary effort is normal.     Breath sounds: Normal breath sounds and air entry. No wheezing, rhonchi or rales.  Lymphadenopathy:     Cervical: No cervical adenopathy.  Neurological:     Mental Status: He is alert.      UC Treatments / Results  Labs (all labs ordered are listed, but only abnormal results are displayed) Labs Reviewed - No data to display  EKG   Radiology No results found.  Procedures Procedures (including critical care time)  Medications Ordered in UC Medications - No data to display  Initial Impression / Assessment and Plan / UC Course  I have reviewed the triage vital signs and the nursing notes.  Pertinent labs & imaging results that were available during my care of the patient were reviewed by me and considered in my medical decision making (see chart for details).    Plan: 1.  The acute cough will be treated with the following: A.  Mucinex DM every 12 hours to help treat the cough. 2.  The acute bronchitis will be treated with the following: A.  Albuterol inhaler, 2 puffs every 6 hours on a regular basis to help decrease cough and chest congestion. B.  Zithromax 250 mg, 2 tablets initially and then 1 tablet daily to treat infection and respiratory inflammation. 3.  Patient advised follow-up PCP or return to urgent care if symptoms fail to improve. Final Clinical Impressions(s) / UC Diagnoses   Final diagnoses:  Acute cough  Acute bronchitis, unspecified organism     Discharge Instructions      Advised to take the Mucinex DM every 12 hours to help control cough and  congestion. Advised to use the albuterol inhaler, 2 puffs every 6 hours on a regular basis as this will help decrease cough and clear congestion. Advised take the Zithromax, 2 tablets initially and then  1 tablet daily until completed as this will treat infection.  Advised follow-up PCP return to urgent care if symptoms fail to improve.    ED Prescriptions     Medication Sig Dispense Auth. Provider   dextromethorphan-guaiFENesin (MUCINEX DM) 30-600 MG 12hr tablet Take 1 tablet by mouth 2 (two) times daily. 20 tablet Ellsworth Lennox, PA-C   azithromycin (ZITHROMAX Z-PAK) 250 MG tablet 2 tablets initially and then 1 tablet daily until completed. 6 each Ellsworth Lennox, PA-C   albuterol (VENTOLIN HFA) 108 (90 Base) MCG/ACT inhaler Inhale 1-2 puffs into the lungs every 6 (six) hours as needed for wheezing or shortness of breath. 8 g Ellsworth Lennox, PA-C      PDMP not reviewed this encounter.   Ellsworth Lennox, PA-C 09/01/22 1240

## 2022-09-01 NOTE — ED Triage Notes (Signed)
Patient presents to UC for productive cough. Was seen at 01/14. Prescribed prednisone and tessalon cough med. States he did not note any improvement.  Denies fever or SOB.

## 2022-09-01 NOTE — Discharge Instructions (Signed)
Advised to take the Mucinex DM every 12 hours to help control cough and congestion. Advised to use the albuterol inhaler, 2 puffs every 6 hours on a regular basis as this will help decrease cough and clear congestion. Advised take the Zithromax, 2 tablets initially and then 1 tablet daily until completed as this will treat infection.  Advised follow-up PCP return to urgent care if symptoms fail to improve.

## 2023-10-20 ENCOUNTER — Other Ambulatory Visit: Payer: Self-pay

## 2023-10-20 ENCOUNTER — Ambulatory Visit
Admission: RE | Admit: 2023-10-20 | Discharge: 2023-10-20 | Disposition: A | Discharge status: Home / Self Care | Source: Ambulatory Visit | Attending: Family Medicine | Admitting: Family Medicine

## 2023-10-20 VITALS — BP 121/73 | HR 95 | Temp 98.5°F | Resp 18 | Ht 67.0 in | Wt 170.0 lb

## 2023-10-20 DIAGNOSIS — U071 COVID-19: Secondary | ICD-10-CM | POA: Diagnosis not present

## 2023-10-20 LAB — POC COVID19/FLU A&B COMBO
Covid Antigen, POC: POSITIVE — AB
Influenza A Antigen, POC: NEGATIVE
Influenza B Antigen, POC: NEGATIVE

## 2023-10-20 NOTE — Discharge Instructions (Addendum)

## 2023-10-20 NOTE — ED Provider Notes (Signed)
 [name]    CSN: [id number] Arrival date & time: 10/20/23  1149      History   Chief Complaint Chief Complaint  Patient presents with   Sore Throat    Sore throat, a little cough, chills, body aches - Entered by patient    HPI Ernest Cooper is a 33 y.o. male.    Sore Throat Pertinent negatives include no chest pain, no headaches and no shortness of breath.  Feeling well since yesterday symptoms include scratchy throat, mild aches and fatigue, chills but no documented fever, slight rhinorrhea and cough.  Denies headache, chest pain, shortness of breath, nausea, vomiting, diarrhea.  He works as a Advertising account planner.  Denies household contacts with illness.  History reviewed. No pertinent past medical history.  There are no active problems to display for this patient.   History reviewed. No pertinent surgical history.     Home Medications    Prior to Admission medications   Medication Sig Start Date End Date Taking? Authorizing Provider  albuterol (VENTOLIN HFA) 108 (90 Base) MCG/ACT inhaler Inhale 1-2 puffs into the lungs every 6 (six) hours as needed for wheezing or shortness of breath. 09/01/22   Ellsworth Lennox, PA-C  azithromycin (ZITHROMAX Z-PAK) 250 MG tablet 2 tablets initially and then 1 tablet daily until completed. 09/01/22   Ellsworth Lennox, PA-C  benzonatate (TESSALON) 100 MG capsule Take 1 capsule (100 mg total) by mouth every 8 (eight) hours as needed for cough. 08/28/22   Gustavus Bryant, FNP  cetirizine (ZYRTEC) 10 MG tablet Take 1 tablet (10 mg total) by mouth daily. 04/16/16   Wallis Bamberg, PA-C  dextromethorphan-guaiFENesin [facility] DM) 30-600 MG 12hr tablet Take 1 tablet by mouth 2 (two) times daily. 09/01/22   Ellsworth Lennox, PA-C  hydrOXYzine (ATARAX/VISTARIL) 25 MG tablet Take 1 tablet (25 mg total) by mouth every 4 (four) hours as needed for itching. 04/23/16   Sherren Mocha, MD  oseltamivir (TAMIFLU) 75 MG capsule Take 1 capsule (75 mg total) by mouth every 12  (twelve) hours. 08/14/18   Coralyn Mark, NP  triamcinolone cream (KENALOG) 0.1 % Apply 1 application topically 4 (four) times daily as needed. 04/23/16   Sherren Mocha, MD    Family History History reviewed. No pertinent family history.  Social History Social History   Tobacco Use   Smoking status: Never   Smokeless tobacco: Never  Vaping Use   Vaping status: Never Used  Substance Use Topics   Alcohol use: No   Drug use: No     Allergies   Patient has no known allergies.   Review of Systems Review of Systems  Constitutional:  Positive for chills and fatigue. Negative for appetite change and fever.  HENT:  Positive for congestion, rhinorrhea, sneezing and sore throat. Negative for ear pain, sinus pressure, trouble swallowing and voice change.   Respiratory:  Positive for cough. Negative for shortness of breath.   Cardiovascular:  Negative for chest pain.  Gastrointestinal:  Negative for diarrhea, nausea and vomiting.  Musculoskeletal:  Positive for myalgias.  Neurological:  Negative for headaches.     Physical Exam Triage Vital Signs ED Triage Vitals  Encounter Vitals Group     BP 10/20/23 1206 121/73     Systolic BP Percentile --      Diastolic BP Percentile --      Pulse Rate 10/20/23 1206 95     Resp 10/20/23 1206 18     Temp 10/20/23 1206 98.5  F (36.9 C)     Temp Source 10/20/23 1206 Oral     SpO2 10/20/23 1206 96 %     Weight 10/20/23 1204 170 lb (77.1 kg)     Height 10/20/23 1204 5\' 7"  (1.702 m)     Head Circumference --      Peak Flow --      Pain Score 10/20/23 1204 5     Pain Loc --      Pain Education --      Exclude from Growth Chart --    No data found.  Updated Vital Signs BP 121/73 (BP Location: Right Arm)   Pulse 95   Temp 98.5 F (36.9 C) (Oral)   Resp 18   Ht 5\' 7"  (1.702 m)   Wt 170 lb (77.1 kg)   SpO2 96%   BMI 26.63 kg/m   Visual Acuity Right Eye Distance:   Left Eye Distance:   Bilateral Distance:    Right Eye  Near:   Left Eye Near:    Bilateral Near:     Physical Exam Vitals reviewed.  Constitutional:      Appearance: He is not ill-appearing.  HENT:     Head: Normocephalic and atraumatic.     Right Ear: Tympanic membrane and ear canal normal.     Left Ear: Tympanic membrane and ear canal normal.     Nose: No rhinorrhea.     Mouth/Throat:     Mouth: Mucous membranes are moist.     Pharynx: Oropharynx is clear. Uvula midline. No pharyngeal swelling, oropharyngeal exudate, posterior oropharyngeal erythema or uvula swelling.     Tonsils: No tonsillar exudate or tonsillar abscesses.  Eyes:     Conjunctiva/sclera: Conjunctivae normal.  Cardiovascular:     Rate and Rhythm: Normal rate. Rhythm irregular.     Heart sounds: No murmur heard. Lymphadenopathy:     Cervical: No cervical adenopathy.  Skin:    General: Skin is warm and dry.  Neurological:     Mental Status: He is alert and oriented to person, place, and time.  Psychiatric:        Mood and Affect: Mood normal.      UC Treatments / Results  Labs (all labs ordered are listed, but only abnormal results are displayed) Labs Reviewed - No data to display  EKG   Radiology No results found.  Procedures Procedures (including critical care time)  Medications Ordered in UC Medications - No data to display  Initial Impression / Assessment and Plan / UC Course  I have reviewed the triage vital signs and the nursing notes.  Pertinent labs & imaging results that were available during my care of the patient were reviewed by me and considered in my medical decision making (see chart for details).     33 year old male with mild URI symptoms since yesterday, vital signs stable exam normal point-of-care COVID-positive, point-of-care flu negative, no risk factors for severe disease OTC meds for symptomatic relief follow-up as needed Final Clinical Impressions(s) / UC Diagnoses   Final diagnoses:  None   Discharge Instructions    None    ED Prescriptions   None    PDMP not reviewed this encounter.   Meliton Rattan, [place] 10/20/23 1228

## 2023-10-20 NOTE — ED Triage Notes (Addendum)
 Pt presents with complaints of sore throat, low grade fever, cough, and generalized body aches that began yesterday morning. Pt currently rates his overall pain a 5/10. OTC Tylenol taken with relief/improvement in symptoms. Denies sick contacts.

## 2024-01-08 ENCOUNTER — Other Ambulatory Visit: Payer: Self-pay

## 2024-01-08 ENCOUNTER — Ambulatory Visit
Admission: RE | Admit: 2024-01-08 | Discharge: 2024-01-08 | Disposition: A | Discharge status: Home / Self Care | Source: Ambulatory Visit | Attending: Physician Assistant | Admitting: Physician Assistant

## 2024-01-08 VITALS — BP 120/74 | HR 58 | Temp 98.0°F | Resp 17 | Ht 67.0 in | Wt 175.0 lb

## 2024-01-08 DIAGNOSIS — R21 Rash and other nonspecific skin eruption: Secondary | ICD-10-CM | POA: Diagnosis not present

## 2024-01-08 MED ORDER — PREDNISONE 20 MG PO TABS: ORAL_TABLET | ORAL | 0 refills | Status: DC

## 2024-01-08 NOTE — ED Provider Notes (Signed)
 [facility]    CSN: [id number] Arrival date & time: 01/08/24  1021      History   Chief Complaint Chief Complaint  Patient presents with   Rash    Entered by patient    HPI Ernest Cooper is a 33 y.o. male.   HPI  Pt presents today with concerns for disseminated rash that has been present for about 2 days and he reports it seems to have gotten worse this AM  He reports it is all over his body  He reports he has been doing some yard work as normal and denies being in woods lately   He denies new soaps, detergents, fragrances, lotions He denies new medications or foods He reports some mild itching   He tried applying some of his son's eczema steroid cream but this did not seem to help much    History reviewed. No pertinent past medical history.  There are no active problems to display for this patient.   History reviewed. No pertinent surgical history.     Home Medications    Prior to Admission medications   Medication Sig Start Date End Date Taking? Authorizing Provider  predniSONE  (DELTASONE ) 20 MG tablet Take 60mg  PO daily x 2 days, then40mg  PO daily x 2 days, then 20mg  PO daily x 3 days 01/08/24  Yes An Schnabel E, PA-C  albuterol  (VENTOLIN  HFA) 108 (90 Base) MCG/ACT inhaler Inhale 1-2 puffs into the lungs every 6 (six) hours as needed for wheezing or shortness of breath. 09/01/22   Gretel Leaven, PA-C    Family History History reviewed. No pertinent family history.  Social History Social History   Tobacco Use   Smoking status: Never   Smokeless tobacco: Never  Vaping Use   Vaping status: Never Used  Substance Use Topics   Alcohol use: No   Drug use: No     Allergies   Patient has no known allergies.   Review of Systems Review of Systems  Constitutional:  Negative for chills, fatigue and fever.  Respiratory:  Negative for chest tightness, shortness of breath and wheezing.   Cardiovascular:  Negative for chest pain, palpitations and  leg swelling.  Musculoskeletal:  Negative for myalgias.  Skin:  Positive for rash.     Physical Exam Triage Vital Signs ED Triage Vitals  Encounter Vitals Group     BP 01/08/24 1037 120/74     Systolic BP Percentile --      Diastolic BP Percentile --      Pulse Rate 01/08/24 1037 (!) 58     Resp 01/08/24 1037 17     Temp 01/08/24 1037 98 F (36.7 C)     Temp Source 01/08/24 1037 Oral     SpO2 01/08/24 1037 96 %     Weight 01/08/24 1037 175 lb (79.4 kg)     Height 01/08/24 1037 5\' 7"  (1.702 m)     Head Circumference --      Peak Flow --      Pain Score 01/08/24 1101 0     Pain Loc --      Pain Education --      Exclude from Growth Chart --    No data found.  Updated Vital Signs BP 120/74 (BP Location: Right Arm)   Pulse (!) 58   Temp 98 F (36.7 C) (Oral)   Resp 17   Ht 5\' 7"  (1.702 m)   Wt 175 lb (79.4 kg)   SpO2 96%  BMI 27.41 kg/m   Visual Acuity Right Eye Distance:   Left Eye Distance:   Bilateral Distance:    Right Eye Near:   Left Eye Near:    Bilateral Near:     Physical Exam Vitals reviewed.  Constitutional:      General: He is awake.     Appearance: Normal appearance. He is well-developed and well-groomed.  HENT:     Head: Normocephalic and atraumatic.  Eyes:     Extraocular Movements: Extraocular movements intact.     Conjunctiva/sclera: Conjunctivae normal.  Pulmonary:     Effort: Pulmonary effort is normal.  Musculoskeletal:     Cervical back: Normal range of motion.  Skin:    General: Skin is warm and dry.     Findings: Erythema and rash present. Rash is macular, papular and urticarial. Rash is not crusting, nodular, purpuric, pustular, scaling or vesicular.     Comments: Pt has diffuse, erythematous maculopapular rash along forearms, chest and trunk as well as his back  No signs of pustules, drainage bleeding    Neurological:     General: No focal deficit present.     Mental Status: He is alert and oriented to person, place, and  time.     GCS: GCS eye subscore is 4. GCS verbal subscore is 5. GCS motor subscore is 6.  Psychiatric:        Attention and Perception: Attention normal.        Mood and Affect: Mood normal.        Speech: Speech normal.        Behavior: Behavior normal. Behavior is cooperative.        Thought Content: Thought content normal.        Judgment: Judgment normal.      UC Treatments / Results  Labs (all labs ordered are listed, but only abnormal results are displayed) Labs Reviewed - No data to display  EKG   Radiology No results found.  Procedures Procedures (including critical care time)  Medications Ordered in UC Medications - No data to display  Initial Impression / Assessment and Plan / UC Course  I have reviewed the triage vital signs and the nursing notes.  Pertinent labs & imaging results that were available during my care of the patient were reviewed by me and considered in my medical decision making (see chart for details).      Final Clinical Impressions(s) / UC Diagnoses   Final diagnoses:  Rash and nonspecific skin eruption   Patient presents today with concerns for diffuse, erythematous, maculopapular rash that has been present for about 2 days.  He denies changes to topicals, detergents, soaps.  He denies new medications or foods.  Physical exam is notable for fine, diffuse, erythematous and maculopapular rash along trunk and forearms.  No signs of drainage, bleeding, excoriation or swelling.  Unsure of the cause for his rash but we will go ahead and send in prednisone  taper and recommend second-generation antihistamine to assist with symptoms.  Recommend topicals such as hydrocortisone cream, Benadryl cream, calamine lotion per her preference.  Follow-up as needed for progressing or persistent symptoms.     Discharge Instructions      You were seen today for concerns of a diffuse itchy rash on your arms and trunk.  At this time I am unsure what is causing  your rash but I suspect this is likely a reaction to something you have come into contact with.  Over the next few  days I recommend using a gentle cleanser that is fragrance and dye free and is formulated for sensitive skin.  Try to take lukewarm showers to avoid further irritating the areas.  I also recommend starting a second-generation antihistamine and I have sent in a medication called prednisone  for you to take as needed.  To assist with persistent itching you can use Benadryl cream, cortisone cream, calamine lotion per your preference.  I also recommend adding an antihistamine to your daily regimen This includes medications like Claritin, Allegra, Zyrtec - the generics of these work very well and are usually less expensive I recommend using Flonase nasal spray - 2 puffs twice per day to help with your nasal congestion The antihistamines and Flonase can take a few weeks to provide significant relief from allergy symptoms but should start to provide some benefit soon.  I have sent in a script for Prednisone  taper to be taken in the morning with breakfast per the instructions on the container Remember that steroids can cause sleeplessness, irritability, increased hunger and elevated glucose levels so be mindful of these side effects. They should lessen as you progress to the lower doses of the taper.  If at any point your symptoms do not seem to be improving or seem to be worsening you can always return here or go to your primary care provider for further evaluation and ongoing management.   ED Prescriptions     Medication Sig Dispense Auth. Provider   predniSONE  (DELTASONE ) 20 MG tablet Take 60mg  PO daily x 2 days, then40mg  PO daily x 2 days, then 20mg  PO daily x 3 days 13 tablet Davarious Tumbleson E, PA-C      PDMP not reviewed this encounter.   Jerona Mooring, PA-C 01/08/24 1121

## 2024-01-08 NOTE — ED Triage Notes (Signed)
 Pt presents with complaints of generalized body rash x 2 days. Denies fevers at home. States the rash itches. OTC Benadryl taken with no relief/noticeable improvement in symptoms. Pt currently denies pain. Denies new body washes or detergents.

## 2024-01-08 NOTE — Discharge Instructions (Addendum)
 You were seen today for concerns of a diffuse itchy rash on your arms and trunk.  At this time I am unsure what is causing your rash but I suspect this is likely a reaction to something you have come into contact with.  Over the next few days I recommend using a gentle cleanser that is fragrance and dye free and is formulated for sensitive skin.  Try to take lukewarm showers to avoid further irritating the areas.  I also recommend starting a second-generation antihistamine and I have sent in a medication called prednisone  for you to take as needed.  To assist with persistent itching you can use Benadryl cream, cortisone cream, calamine lotion per your preference.  I also recommend adding an antihistamine to your daily regimen This includes medications like Claritin, Allegra, Zyrtec - the generics of these work very well and are usually less expensive I recommend using Flonase nasal spray - 2 puffs twice per day to help with your nasal congestion The antihistamines and Flonase can take a few weeks to provide significant relief from allergy symptoms but should start to provide some benefit soon.  I have sent in a script for Prednisone  taper to be taken in the morning with breakfast per the instructions on the container Remember that steroids can cause sleeplessness, irritability, increased hunger and elevated glucose levels so be mindful of these side effects. They should lessen as you progress to the lower doses of the taper.  If at any point your symptoms do not seem to be improving or seem to be worsening you can always return here or go to your primary care provider for further evaluation and ongoing management.

## 2024-04-25 ENCOUNTER — Ambulatory Visit
Admission: RE | Admit: 2024-04-25 | Discharge: 2024-04-25 | Disposition: A | Discharge status: Home / Self Care | Attending: Physician Assistant | Admitting: Physician Assistant

## 2024-04-25 ENCOUNTER — Other Ambulatory Visit: Payer: Self-pay

## 2024-04-25 VITALS — BP 102/57 | HR 73 | Temp 98.5°F | Resp 17 | Ht 67.0 in | Wt 170.0 lb

## 2024-04-25 DIAGNOSIS — R0989 Other specified symptoms and signs involving the circulatory and respiratory systems: Secondary | ICD-10-CM

## 2024-04-25 DIAGNOSIS — R051 Acute cough: Secondary | ICD-10-CM

## 2024-04-25 LAB — POC COVID19/FLU A&B COMBO
Covid Antigen, POC: NEGATIVE
Influenza A Antigen, POC: NEGATIVE
Influenza B Antigen, POC: NEGATIVE

## 2024-04-25 NOTE — Discharge Instructions (Addendum)
 Your COVID and flu testing were negative.   Based on your described symptoms and the duration of symptoms it is likely that you have a viral upper respiratory infection (often called a cold)  Symptoms can last for 3-10 days with lingering cough and intermittent symptoms potentially  lasting several  weeks after that.  The goal of treatment at this time is to reduce your symptoms and discomfort   You can use the following medications and measures to help yourself feel better until your body fights this off: DayQuil/NyQuil, TheraFlu, Alka-Seltzer  (these medications typically have the same active ingredients in them so you can choose whichever one you prefer and take consistently during the day and night according to the manufactures instructions.) Flonase A daily antihistamine such as Zyrtec , Claritin, Allegra per your preference.  Please choose 1 and take consistently. Increased fluids.  It is recommended that you take in at least 64 ounces of water per day when you are not sick so it is important to increase this when you are sick and your body may be running fever. Rest Cough drops Chloraseptic throat spray to help with sore throat Nasal saline spray or nasal flushes to help with congestion and runny nose  If your symptoms seem like they are getting worse over the next 5 to 7 days or not improving you can always follow-up here in urgent care or go to your primary care provider for further management. Go to the ER if you begin to have more serious symptoms such as shortness of breath, trouble breathing, loss of consciousness, swelling around the eyes, high fever, severe lasting headaches, vision changes or neck pain/stiffness.

## 2024-04-25 NOTE — ED Provider Notes (Signed)
 [facility]    CSN: [id number] Arrival date & time: 04/25/24  1913      History   Chief Complaint Chief Complaint  Patient presents with   Cough    Entered by patient    HPI Dre Gamino is a 33 y.o. male.   HPI  Discussed the use of AI scribe software for clinical note transcription with the patient, who gave verbal consent to proceed.  The patient presents with a worsening cough.  The cough has worsened over the past three days and is productive with mucus, felt primarily in the throat. There is associated postnasal drainage and a slight runny nose that began today. No sore throat, ear pain, fever, or chills are present.  He experiences mild shortness of breath and a slight headache related to the coughing. No symptoms of nausea, vomiting, diarrhea, body aches, rashes, or dizziness. He has not been in contact with anyone with similar symptoms and has not traveled recently.  For symptom relief, he has been using cough drops and tried ginger tea last night.   History reviewed. No pertinent past medical history.  There are no active problems to display for this patient.   History reviewed. No pertinent surgical history.     Home Medications    Prior to Admission medications   Medication Sig Start Date End Date Taking? Authorizing Provider  albuterol  (VENTOLIN  HFA) 108 (90 Base) MCG/ACT inhaler Inhale 1-2 puffs into the lungs every 6 (six) hours as needed for wheezing or shortness of breath. 09/01/22   Lynwood Lenis, PA-C  predniSONE  (DELTASONE ) 20 MG tablet Take 60mg  PO daily x 2 days, then40mg  PO daily x 2 days, then 20mg  PO daily x 3 days 01/08/24   Bentlee Drier E, PA-C    Family History History reviewed. No pertinent family history.  Social History Social History   Tobacco Use   Smoking status: Never   Smokeless tobacco: Never  Vaping Use   Vaping status: Never Used  Substance Use Topics   Alcohol use: No   Drug use: No     Allergies    Patient has no known allergies.   Review of Systems Review of Systems  Constitutional:  Negative for chills and fever.  HENT:  Positive for postnasal drip and rhinorrhea. Negative for congestion, ear pain and sore throat.   Respiratory:  Positive for cough and shortness of breath. Negative for wheezing.   Gastrointestinal:  Negative for diarrhea, nausea and vomiting.  Musculoskeletal:  Negative for myalgias.  Skin:  Negative for rash.  Neurological:  Positive for headaches. Negative for dizziness and light-headedness.     Physical Exam Triage Vital Signs ED Triage Vitals  Encounter Vitals Group     BP 04/25/24 1930 (!) 102/57     Girls Systolic BP Percentile --      Girls Diastolic BP Percentile --      Boys Systolic BP Percentile --      Boys Diastolic BP Percentile --      Pulse Rate 04/25/24 1930 73     Resp 04/25/24 1930 17     Temp 04/25/24 1930 98.5 F (36.9 C)     Temp Source 04/25/24 1930 Oral     SpO2 04/25/24 1930 97 %     Weight 04/25/24 1932 170 lb (77.1 kg)     Height 04/25/24 1932 5' 7 (1.702 m)     Head Circumference --      Peak Flow --  Pain Score 04/25/24 1932 0     Pain Loc --      Pain Education --      Exclude from Growth Chart --    No data found.  Updated Vital Signs BP (!) 102/57 (BP Location: Right Arm)   Pulse 73   Temp 98.5 F (36.9 C) (Oral)   Resp 17   Ht 5' 7 (1.702 m)   Wt 170 lb (77.1 kg)   SpO2 97%   BMI 26.63 kg/m   Visual Acuity Right Eye Distance:   Left Eye Distance:   Bilateral Distance:    Right Eye Near:   Left Eye Near:    Bilateral Near:     Physical Exam Vitals reviewed.  Constitutional:      General: He is awake.     Appearance: Normal appearance. He is well-developed and well-groomed.  HENT:     Head: Normocephalic and atraumatic.     Right Ear: Hearing, tympanic membrane and ear canal normal.     Left Ear: Hearing, tympanic membrane and ear canal normal.     Mouth/Throat:     Lips: Pink.      Mouth: Mucous membranes are moist.     Pharynx: Oropharynx is clear. Uvula midline. No pharyngeal swelling, oropharyngeal exudate, posterior oropharyngeal erythema, uvula swelling or postnasal drip.     Tonsils: No tonsillar exudate or tonsillar abscesses.  Eyes:     General: Lids are normal. Gaze aligned appropriately.     Extraocular Movements: Extraocular movements intact.  Cardiovascular:     Rate and Rhythm: Normal rate and regular rhythm.     Heart sounds: Normal heart sounds.  Pulmonary:     Effort: Pulmonary effort is normal.     Breath sounds: Normal breath sounds. No decreased air movement. No decreased breath sounds, wheezing, rhonchi or rales.  Musculoskeletal:     Cervical back: Normal range of motion and neck supple.  Lymphadenopathy:     Head:     Right side of head: No submental, submandibular or preauricular adenopathy.     Left side of head: No submental, submandibular or preauricular adenopathy.     Cervical:     Right cervical: No superficial cervical adenopathy.    Left cervical: No superficial cervical adenopathy.     Upper Body:     Right upper body: No supraclavicular adenopathy.     Left upper body: No supraclavicular adenopathy.  Skin:    General: Skin is warm and dry.  Neurological:     Mental Status: He is alert and oriented to person, place, and time.  Psychiatric:        Mood and Affect: Mood normal.        Behavior: Behavior normal. Behavior is cooperative.        Thought Content: Thought content normal.        Judgment: Judgment normal.      UC Treatments / Results  Labs (all labs ordered are listed, but only abnormal results are displayed) Labs Reviewed  POC COVID19/FLU A&B COMBO    EKG   Radiology No results found.  Procedures Procedures (including critical care time)  Medications Ordered in UC Medications - No data to display  Initial Impression / Assessment and Plan / UC Course  I have reviewed the triage vital signs and the  nursing notes.  Pertinent labs & imaging results that were available during my care of the patient were reviewed by me and considered in my medical decision  making (see chart for details).      Final Clinical Impressions(s) / UC Diagnoses   Final diagnoses:  Acute cough  Symptoms of upper respiratory infection (URI)   Acute upper respiratory infection with cough Acute upper respiratory infection with cough for three days, accompanied by mucus production, postnasal drip, mild nasal congestion, and slight headache. No fever, chills, or significant respiratory distress. COVID and flu tests negative in clinic-results reviewed during visit. Physical exam and vitals are reassuring - no evidence of severe infection or respiratory distress. Symptoms are mild and manageable with over-the-counter treatments.  - Recommend symptomatic treatment with over-the-counter medications such as DayQuil, Nyquil, Theraflu, or Alka Seltzer, ensuring day and night versions are used. - Advise use of a humidifier at night. - Suggest warm tea with honey for cough relief and sore throat.  - Instruct to seek emergency care if significant trouble breathing, chest pain, persistent fever, or inability to eat or drink develops.     Discharge Instructions      Based on your described symptoms and the duration of symptoms it is likely that you have a viral upper respiratory infection (often called a cold)  Symptoms can last for 3-10 days with lingering cough and intermittent symptoms potentially  lasting several  weeks after that.  The goal of treatment at this time is to reduce your symptoms and discomfort   You can use the following medications and measures to help yourself feel better until your body fights this off: DayQuil/NyQuil, TheraFlu, Alka-Seltzer  (these medications typically have the same active ingredients in them so you can choose whichever one you prefer and take consistently during the day and night  according to the manufactures instructions.) Flonase A daily antihistamine such as Zyrtec , Claritin, Allegra per your preference.  Please choose 1 and take consistently. Increased fluids.  It is recommended that you take in at least 64 ounces of water per day when you are not sick so it is important to increase this when you are sick and your body may be running fever. Rest Cough drops Chloraseptic throat spray to help with sore throat Nasal saline spray or nasal flushes to help with congestion and runny nose  If your symptoms seem like they are getting worse over the next 5 to 7 days or not improving you can always follow-up here in urgent care or go to your primary care provider for further management. Go to the ER if you begin to have more serious symptoms such as shortness of breath, trouble breathing, loss of consciousness, swelling around the eyes, high fever, severe lasting headaches, vision changes or neck pain/stiffness.       ED Prescriptions   None    PDMP not reviewed this encounter.   Marylene Rocky BRAVO, PA-C 04/25/24 [id number]

## 2024-04-25 NOTE — ED Triage Notes (Signed)
 Pt presents with a chief complaint of cough x 2 days. Does endorse some SOB. Currently denies pain. Only has taken cough drops for cough, temporary relief. Around many clients at work, unsure of any sick contacts.
